# Patient Record
Sex: Male | Born: 1985 | Race: White | Hispanic: No | Marital: Married | Smoking: Never smoker
Health system: Southern US, Community
[De-identification: ages and names within clinical notes are randomized; demographics above are authoritative.]

## PROBLEM LIST (undated history)

## (undated) HISTORY — PX: HERNIA REPAIR: SHX51

---

## 2017-11-14 ENCOUNTER — Emergency Department
Admission: EM | Admit: 2017-11-14 | Discharge: 2017-11-14 | Disposition: A | Discharge status: Home / Self Care | Payer: 59 | Attending: Emergency Medicine | Admitting: Emergency Medicine

## 2017-11-14 ENCOUNTER — Encounter: Payer: Self-pay | Admitting: Emergency Medicine

## 2017-11-14 ENCOUNTER — Emergency Department: Payer: 59

## 2017-11-14 ENCOUNTER — Other Ambulatory Visit: Payer: Self-pay

## 2017-11-14 DIAGNOSIS — Z79899 Other long term (current) drug therapy: Secondary | ICD-10-CM | POA: Diagnosis not present

## 2017-11-14 DIAGNOSIS — R002 Palpitations: Secondary | ICD-10-CM | POA: Insufficient documentation

## 2017-11-14 DIAGNOSIS — R55 Syncope and collapse: Secondary | ICD-10-CM | POA: Diagnosis not present

## 2017-11-14 LAB — FIBRIN DERIVATIVES D-DIMER (ARMC ONLY): Fibrin derivatives D-dimer (ARMC): 256.92 ng/mL (FEU) (ref 0.00–499.00)

## 2017-11-14 LAB — COMPREHENSIVE METABOLIC PANEL
ALK PHOS: 61 U/L (ref 38–126)
ALT: 182 U/L — AB (ref 17–63)
AST: 111 U/L — AB (ref 15–41)
Albumin: 4.7 g/dL (ref 3.5–5.0)
Anion gap: 12 (ref 5–15)
BILIRUBIN TOTAL: 1.4 mg/dL — AB (ref 0.3–1.2)
BUN: 15 mg/dL (ref 6–20)
CALCIUM: 9.3 mg/dL (ref 8.9–10.3)
CO2: 25 mmol/L (ref 22–32)
CREATININE: 0.81 mg/dL (ref 0.61–1.24)
Chloride: 102 mmol/L (ref 101–111)
GFR calc Af Amer: >60 mL/min (ref >60)
GFR calc non Af Amer: >60 mL/min (ref >60)
Glucose, Bld: 102 mg/dL — ABNORMAL HIGH (ref 65–99)
Potassium: 3.6 mmol/L (ref 3.5–5.1)
Sodium: 139 mmol/L (ref 135–145)
TOTAL PROTEIN: 7.8 g/dL (ref 6.5–8.1)

## 2017-11-14 LAB — CBC
HEMATOCRIT: 40.5 % (ref 40.0–52.0)
Hemoglobin: 14.2 g/dL (ref 13.0–18.0)
MCH: 29.4 pg (ref 26.0–34.0)
MCHC: 35.1 g/dL (ref 32.0–36.0)
MCV: 83.8 fL (ref 80.0–100.0)
Platelets: 201 10*3/uL (ref 150–440)
RBC: 4.84 MIL/uL (ref 4.40–5.90)
RDW: 13.3 % (ref 11.5–14.5)
WBC: 9.4 10*3/uL (ref 3.8–10.6)

## 2017-11-14 LAB — TROPONIN I

## 2017-11-14 LAB — APTT: aPTT: 29 seconds (ref 24–36)

## 2017-11-14 LAB — PROTIME-INR
INR: 1
Prothrombin Time: 13.1 seconds (ref 11.4–15.2)

## 2017-11-14 MED ORDER — SODIUM CHLORIDE 0.9 % IV SOLN
1000.0000 mL | Freq: Once | INTRAVENOUS | Status: AC
  Administered 2017-11-14: 1000 mL via INTRAVENOUS

## 2017-11-14 MED ORDER — IOPAMIDOL (ISOVUE-370) INJECTION 76%
75.0000 mL | Freq: Once | INTRAVENOUS | Status: AC | PRN
  Administered 2017-11-14: 75 mL via INTRAVENOUS

## 2017-11-14 NOTE — ED Provider Notes (Signed)
[facility] Emergency Department Provider Note   ____________________________________________    I have reviewed the triage vital signs and the nursing notes.   HISTORY  Chief Complaint Palpitations     HPI Eugene RedheadDavid Hartje Jr. is a 31 y.o. male who presents after palpitations with near syncopal episode.  Patient reports he was walking to a meeting this morning when he started to feel flushed and lightheaded.  He then felt his heart began racing very rapidly.  He sat still for 15 minutes and did feel somewhat better however when he went to walk down the hall started again.  Symptoms started at 8:00 and then again at 830.  He was not comfortable driving to the hospital.  He has never had this before.  He denies chest pain.  He does feel mild shortness of breath when the symptoms were occurring.  No fevers or chills.  Recent travel to [place] however no calf pain or swelling.  No history of PE.   History reviewed. No pertinent past medical history.  There are no active problems to display for this patient.   History reviewed. No pertinent surgical history.  Prior to Admission medications   Medication Sig Start Date End Date Taking? Authorizing Provider  Multiple Vitamin (MULTI-VITAMINS) TABS Take 1 tablet by mouth daily.   Yes [provider]     Allergies Patient has no known allergies.  History reviewed. No pertinent family history.  Social History Social History   Tobacco Use  . Smoking status: Not on file  Substance Use Topics  . Alcohol use: No    Frequency: Never  . Drug use: No    Review of Systems  Constitutional: No fever/chills Eyes: No visual changes.  ENT: No sore throat. Cardiovascular: As above Respiratory: As above Gastrointestinal: No abdominal pain.  No nausea, no vomiting.   Genitourinary: Negative for dysuria. Musculoskeletal: Negative for back pain. Skin: Negative for rash. Neurological: Negative for  headaches    ____________________________________________   PHYSICAL EXAM:  VITAL SIGNS: ED Triage Vitals  Enc Vitals Group     BP 11/14/17 0937 (!) 165/80     Pulse Rate 11/14/17 0937 95     Resp 11/14/17 0937 20     Temp 11/14/17 0937 97.9 F (36.6 C)     Temp Source 11/14/17 0937 Oral     SpO2 11/14/17 0937 99 %     Weight 11/14/17 0938 77.1 kg (170 lb)     Height 11/14/17 0938 1.829 m (6')     Head Circumference --      Peak Flow --      Pain Score 11/14/17 0937 0     Pain Loc --      Pain Edu? --      Excl. in GC? --     Constitutional: Alert and oriented. No acute distress. Pleasant and interactive Eyes: Conjunctivae are normal.   Nose: No congestion/rhinnorhea. Mouth/Throat: Mucous membranes are moist.    Cardiovascular: Normal rate, regular rhythm. Grossly normal heart sounds.  Good peripheral circulation. Respiratory: Normal respiratory effort.  No retractions. Lungs CTAB. Gastrointestinal: Soft and nontender. No distention.  No CVA tenderness. Genitourinary: deferred Musculoskeletal: No lower extremity tenderness nor edema.  Warm and well perfused Neurologic:  Normal speech and language. No gross focal neurologic deficits are appreciated.  Skin:  Skin is warm, dry and intact. No rash noted. Psychiatric: Mood and affect are normal. Speech and behavior are normal.  ____________________________________________   LABS (  all labs ordered are listed, but only abnormal results are displayed)  Labs Reviewed  COMPREHENSIVE METABOLIC PANEL - Abnormal; Notable for the following components:      Result Value   Glucose, Bld 102 (*)    AST 111 (*)    ALT 182 (*)    Total Bilirubin 1.4 (*)    All other components within normal limits  APTT  PROTIME-INR  CBC  TROPONIN I  FIBRIN DERIVATIVES D-DIMER ([facility] ONLY)   ____________________________________________  EKG  ED ECG REPORT I, [name], Nuala Chiles, the attending physician, personally viewed and interpreted this  ECG.  Date: 11/14/2017   Rhythm: normal sinus rhythm QRS Axis: normal Intervals: normal ST/T Wave abnormalities: normal Narrative Interpretation: no evidence of acute ischemia  ____________________________________________  RADIOLOGY  Chest x-ray normal CT angiography normal ____________________________________________   PROCEDURES  Procedure(s) performed: No  Procedures   Critical Care performed: No ____________________________________________   INITIAL IMPRESSION / ASSESSMENT AND PLAN / ED COURSE  Pertinent labs & imaging results that were available during my care of the patient were reviewed by me and considered in my medical decision making (see chart for details).  Patient presents with episode of palpitations, mild shortness of breath.  Now essentially resolved.  Lab work was greatly reassuring, EKG normal.  Despite negative d-dimer the patient did complain of some difficulty taking a deep breath, given this we did order CT angiography after discussion with the patient.  CT angiography was reassuring.  Patient was able to ambulate without symptoms and felt much better.  Discussed with Dr. Kirke CorinArida of cardiology who will follow up with the patient regarding elevated liver enzymes and place long-term monitor to evaluate for arrhythmia    ____________________________________________   FINAL CLINICAL IMPRESSION(S) / ED DIAGNOSES  Final diagnoses:  Heart palpitations        Note:  This document was prepared using Dragon voice recognition software and may include unintentional dictation errors.     [name], Mariadelcarmen Corella, MD 11/14/17 1536

## 2017-11-14 NOTE — ED Notes (Signed)
ED Provider at bedside. 

## 2017-11-14 NOTE — ED Notes (Addendum)
Patient ambulated with a steady gait in the hallway, pulse stayed in the low 90's (92-94), pulse ox in the high 90's to 100. No c/o dyspnea or discomfort was noted. Patient was able to speak in complete sentences without difficulty.

## 2017-11-14 NOTE — ED Triage Notes (Signed)
Pt reports that he was walking to a meeting felt his heart racing, he felt SOB and that he was going to pass out. He got sweaty and tried to drive but was unable a co-worker brought him. States that he is SOB when he talks a little. He did travel [place] and back this weekend.

## 2017-11-21 ENCOUNTER — Other Ambulatory Visit
Admission: RE | Admit: 2017-11-21 | Discharge: 2017-11-21 | Disposition: A | Discharge status: Home / Self Care | Payer: 59 | Source: Ambulatory Visit | Attending: Cardiovascular Disease | Admitting: Cardiovascular Disease

## 2017-11-21 ENCOUNTER — Encounter: Payer: Self-pay | Admitting: Cardiovascular Disease

## 2017-11-21 ENCOUNTER — Ambulatory Visit (INDEPENDENT_AMBULATORY_CARE_PROVIDER_SITE_OTHER): Payer: 59 | Admitting: Cardiovascular Disease

## 2017-11-21 VITALS — BP 112/76 | HR 59 | Ht 72.0 in | Wt 172.2 lb

## 2017-11-21 DIAGNOSIS — Z7689 Persons encountering health services in other specified circumstances: Secondary | ICD-10-CM

## 2017-11-21 DIAGNOSIS — B2790 Infectious mononucleosis, unspecified without complication: Secondary | ICD-10-CM

## 2017-11-21 DIAGNOSIS — R0602 Shortness of breath: Secondary | ICD-10-CM | POA: Diagnosis not present

## 2017-11-21 DIAGNOSIS — R002 Palpitations: Secondary | ICD-10-CM

## 2017-11-21 LAB — HEPATIC FUNCTION PANEL
ALBUMIN: 4.6 g/dL (ref 3.5–5.0)
ALT: 120 U/L — AB (ref 17–63)
AST: 63 U/L — AB (ref 15–41)
Alkaline Phosphatase: 57 U/L (ref 38–126)
BILIRUBIN DIRECT: 0.1 mg/dL (ref 0.1–0.5)
BILIRUBIN TOTAL: 1.1 mg/dL (ref 0.3–1.2)
Indirect Bilirubin: 1 mg/dL — ABNORMAL HIGH (ref 0.3–0.9)
Total Protein: 7.5 g/dL (ref 6.5–8.1)

## 2017-11-21 LAB — MONONUCLEOSIS SCREEN: Mono Screen: NEGATIVE

## 2017-11-21 LAB — TSH: TSH: 2.34 u[IU]/mL (ref 0.350–4.500)

## 2017-11-21 NOTE — Patient Instructions (Addendum)
Medication Instructions:  Your physician recommends that you continue on your current medications as directed. Please refer to the Current Medication list given to you today.   Labwork: TSH, liver profile, mono screen  Testing/Procedures: Your physician has recommended that you wear a holter monitor. Holter monitors are medical devices that record the heart's electrical activity. Doctors most often use these monitors to diagnose arrhythmias. Arrhythmias are problems with the speed or rhythm of the heartbeat. The monitor is a small, portable device. You can wear one while you do your normal daily activities. This is usually used to diagnose what is causing palpitations/syncope (passing out).  Your physician has requested that you have an echocardiogram. Echocardiography is a painless test that uses sound waves to create images of your heart. It provides your doctor with information about the size and shape of your heart and how well your heart's chambers and valves are working. This procedure takes approximately one hour. There are no restrictions for this procedure.    Follow-Up: Your physician recommends that you schedule a follow-up appointment in: 1 month with Dr. Kirke CorinArida.    Any Other Special Instructions Will Be Listed Below (If Applicable). Please consider purchasing an [employer] monitor.      If you need a refill on your cardiac medications before your next appointment, please call your pharmacy.  Echocardiogram An echocardiogram, or echocardiography, uses sound waves (ultrasound) to produce an image of your heart. The echocardiogram is simple, painless, obtained within a short period of time, and offers valuable information to your health care provider. The images from an echocardiogram can provide information such as:  Evidence of coronary artery disease (CAD).  Heart size.  Heart muscle function.  Heart valve function.  Aneurysm detection.  Evidence of a past heart  attack.  Fluid buildup around the heart.  Heart muscle thickening.  Assess heart valve function.  Tell a health care provider about:  Any allergies you have.  All medicines you are taking, including vitamins, herbs, eye drops, creams, and over-the-counter medicines.  Any problems you or family members have had with anesthetic medicines.  Any blood disorders you have.  Any surgeries you have had.  Any medical conditions you have.  Whether you are pregnant or may be pregnant. What happens before the procedure? No special preparation is needed. Eat and drink normally. What happens during the procedure?  In order to produce an image of your heart, gel will be applied to your chest and a wand-like tool (transducer) will be moved over your chest. The gel will help transmit the sound waves from the transducer. The sound waves will harmlessly bounce off your heart to allow the heart images to be captured in real-time motion. These images will then be recorded.  You may need an IV to receive a medicine that improves the quality of the pictures. What happens after the procedure? You may return to your normal schedule including diet, activities, and medicines, unless your health care provider tells you otherwise. This information is not intended to replace advice given to you by your health care provider. Make sure you discuss any questions you have with your health care provider. Document Released: 12/03/2000 Document Revised: 07/24/2016 Document Reviewed: 08/13/2013 Elsevier Interactive Patient Education  2017 Elsevier Inc.  Holter Monitoring A Holter monitor is a small device that is used to detect abnormal heart rhythms. It clips to your clothing and is connected by wires to flat, sticky disks (electrodes) that attach to your chest. It is worn  continuously for 24-48 hours. Follow these instructions at home:  Wear your Holter monitor at all times, even while exercising and sleeping,  for as long as directed by your health care provider.  Make sure that the Holter monitor is safely clipped to your clothing or close to your body as recommended by your health care provider.  Do not get the monitor or wires wet.  Do not put body lotion or moisturizer on your chest.  Keep your skin clean.  Keep a diary of your daily activities, such as walking and doing chores. If you feel that your heartbeat is abnormal or that your heart is fluttering or skipping a beat: ? Record what you are doing when it happens. ? Record what time of day the symptoms occur.  Return your Holter monitor as directed by your health care provider.  Keep all follow-up visits as directed by your health care provider. This is important. Get help right away if:  You feel lightheaded or you faint.  You have trouble breathing.  You feel pain in your chest, upper arm, or jaw.  You feel sick to your stomach and your skin is pale, cool, or damp.  You heartbeat feels unusual or abnormal. This information is not intended to replace advice given to you by your health care provider. Make sure you discuss any questions you have with your health care provider. Document Released: 09/03/2004 Document Revised: 05/13/2016 Document Reviewed: 07/15/2014 Elsevier Interactive Patient Education  [employer]2018 Elsevier Inc.

## 2017-11-21 NOTE — Progress Notes (Signed)
Cardiology Office Note   Date:  11/21/2017   ID:  Eugene Edwards Jr., DOB 02/04/1986, MRN [id number]  PCP:  Patient, No Pcp Per  Cardiologist:   Lorine BearsMuhammad Emireth Cockerham, MD   Chief Complaint  Patient presents with  . other    Heart palpitations, sob, syncopal spells with exertion, confusion. Meds reviewed verbally with pt.      History of Present Illness: Eugene RedheadDavid Drozdowski Jr. is a 31 y.o. male who was [facility] emergency room [facility] for evaluation of palpitations and shortness of breath.  He has no prior cardiac history.  He is not a smoker and has no family history of coronary artery disease.  He reports having an echocardiogram done in [place] earlier this year for atypical chest pain.  The echocardiogram was unremarkable according to him. He does have known history of anxiety and used to be on Paxil in the past but not currently. He had symptoms suggestive of a viral illness before Thanksgiving.  Around that time, he drove to [place] to be with family.  He felt very fatigued.  When he returned he went to work on Monday and had episodes of palpitations with extreme dizziness, shortness of breath and foggy mentation.  He went to the emergency room with the symptoms.  His workup was unremarkable including CTA of the chest.  His labs were unremarkable except for elevated AST and ALT.  He denies excessive alcohol use.  He reports having a total of 6 drinks since October.  Since his visit to the emergency room, he had 2 further episodes of palpitations and dizziness.  He has been extremely anxious since then.  He does not consume excessive amount of caffeine.  He drinks an average of 2 cups coffee daily.  He does complain of increased headache in the back of his head.  He reports increased stress lately.  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    History reviewed. No pertinent past medical history.  Past Surgical History:  Procedure Laterality Date  . HERNIA REPAIR        Current Outpatient Medications  Medication Sig Dispense Refill  . Multiple Vitamin (MULTI-VITAMINS) TABS Take 1 tablet by mouth daily.     No current facility-administered medications for this visit.     Allergies:   Erythromycin    Social History:  The patient  reports that  has never smoked. he has never used smokeless tobacco. He reports that he drinks alcohol. He reports that he does not use drugs.   Family History:  The patient's family history includes Hypertension in his father.    ROS:  Please see the history of present illness.   Otherwise, review of systems are positive for none.   All other systems are reviewed and negative.    PHYSICAL EXAM: VS:  BP 112/76   Pulse (!) 59   Ht 6' (1.829 m)   Wt 172 lb 4 oz (78.1 kg)   BMI 23.36 kg/m  , BMI Body mass index is 23.36 kg/m. GEN: Well nourished, well developed, in no acute distress  HEENT: normal  Neck: no JVD, carotid bruits, or masses Cardiac: RRR; no murmurs, rubs, or gallops,no edema  Respiratory:  clear to auscultation bilaterally, normal work of breathing GI: soft, nontender, nondistended, + BS MS: no deformity or atrophy  Skin: warm and dry, no rash Neuro:  Strength and sensation are intact Psych: euthymic mood, full affect   EKG:  EKG is ordered today.  The ekg ordered today demonstrates sinus bradycardia with no significant ST or T wave changes.   Recent Labs: 11/14/2017: ALT 182; BUN 15; Creatinine, Ser 0.81; Hemoglobin 14.2; Platelets 201; Potassium 3.6; Sodium 139    Lipid Panel No results found for: CHOL, TRIG, HDL, CHOLHDL, VLDL, LDLCALC, LDLDIRECT    Wt Readings from Last 3 Encounters:  11/21/17 172 lb 4 oz (78.1 kg)  11/14/17 170 lb (77.1 kg)      PAD Screen 11/21/2017  Previous PAD dx? No  Previous surgical procedure? No  Pain with walking? No  Feet/toe relief with dangling? No  Painful, non-healing ulcers? No  Extremities discolored? No      ASSESSMENT AND PLAN:  1.   Palpitations with presyncope: The exact etiology is not entirely clear.  His cardiac exam is unremarkable and baseline EKG is normal.  His recent labs were unremarkable except for elevated ALT and AST. I am going to repeat his liver profile and check TSH as well.  He does not consume excessive amount of caffeine.  I requested a 48-hour Holter monitor and suggested that he purchases his own smart phone monitors if his symptoms persist. I do suspect that anxiety is playing a role in his symptoms.  2.  Shortness of breath: I think we have to exclude myocardial involvement with recent viral illness leading to possible myocarditis or pericarditis.  I requested an echocardiogram for evaluation.  I doubt the need for GXT given minimal risk factors for coronary artery disease.  3.  Abnormal liver enzymes: Unclear etiology.  No excessive alcohol intake.  I am going to repeat his labs and check infectious mononucleosis antibodies.    Disposition:   FU with me in 1 month  Signed,  Lorine BearsMuhammad Slayton Lubitz, MD  11/21/2017 2:37 PM    [facility]

## 2017-11-22 ENCOUNTER — Other Ambulatory Visit: Payer: Self-pay

## 2017-11-22 ENCOUNTER — Ambulatory Visit (INDEPENDENT_AMBULATORY_CARE_PROVIDER_SITE_OTHER): Payer: 59

## 2017-11-22 DIAGNOSIS — R0602 Shortness of breath: Secondary | ICD-10-CM

## 2017-11-22 DIAGNOSIS — R002 Palpitations: Secondary | ICD-10-CM

## 2017-11-25 ENCOUNTER — Ambulatory Visit
Admission: RE | Admit: 2017-11-25 | Discharge: 2017-11-25 | Disposition: A | Discharge status: Home / Self Care | Payer: 59 | Source: Ambulatory Visit | Attending: Cardiovascular Disease | Admitting: Cardiovascular Disease

## 2017-11-25 DIAGNOSIS — I491 Atrial premature depolarization: Secondary | ICD-10-CM | POA: Diagnosis not present

## 2017-11-25 DIAGNOSIS — R002 Palpitations: Secondary | ICD-10-CM | POA: Insufficient documentation

## 2017-11-25 DIAGNOSIS — I493 Ventricular premature depolarization: Secondary | ICD-10-CM | POA: Diagnosis not present

## 2017-11-29 ENCOUNTER — Ambulatory Visit: Payer: 59 | Admitting: Cardiovascular Disease

## 2017-12-15 ENCOUNTER — Ambulatory Visit: Payer: 59 | Admitting: Cardiovascular Disease

## 2017-12-21 ENCOUNTER — Encounter: Payer: Self-pay | Admitting: Cardiovascular Disease

## 2017-12-22 ENCOUNTER — Ambulatory Visit: Payer: 59 | Admitting: Nurse Practitioner

## 2019-03-10 IMAGING — CT CT ANGIO CHEST
2 of 7 series · 19 of 46 positions shown · IV contrast (iopamidol)
Comparison: Chest x-ray from same day.

CLINICAL DATA: Acute onset shortness of breath.

EXAM:
CT ANGIOGRAPHY CHEST WITH CONTRAST
TECHNIQUE: Multidetector CT imaging of the chest was performed using the
standard protocol during bolus administration of intravenous
contrast. Multiplanar CT image reconstructions and MIPs were
obtained to evaluate the vascular anatomy.
CONTRAST:  75mL YUXX9L-BWI IOPAMIDOL (YUXX9L-BWI) INJECTION 76%

[Series 5: thins · axial · 0.71mm/px · z∈[-343,-64]mm · 17 of 315 slices shown]
[im 18/315  lung]
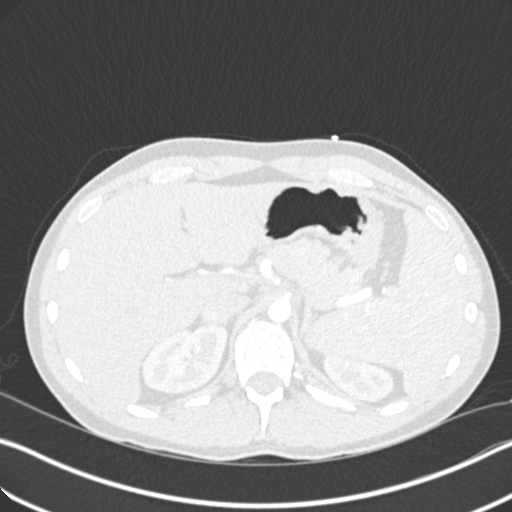
[im 35/315  soft-tissue]
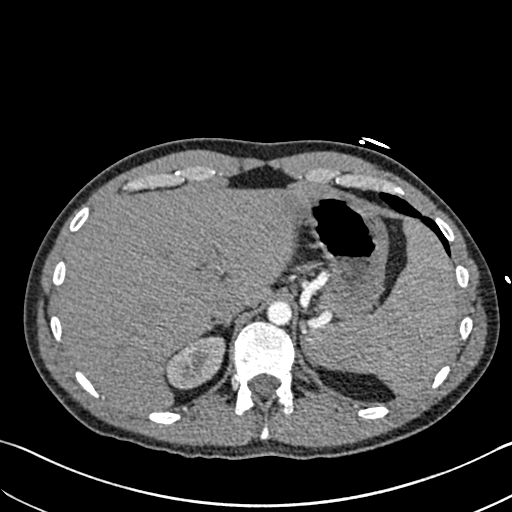
[im 53/315  lung]
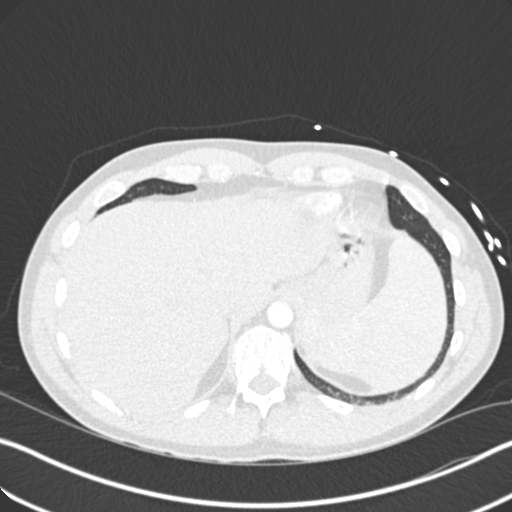
[im 70/315  soft-tissue]
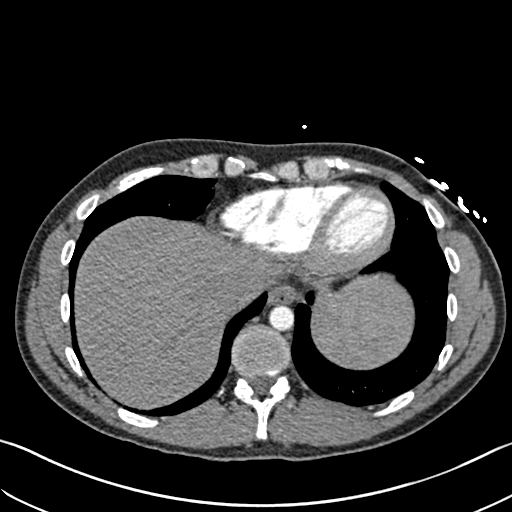
[im 88/315  lung]
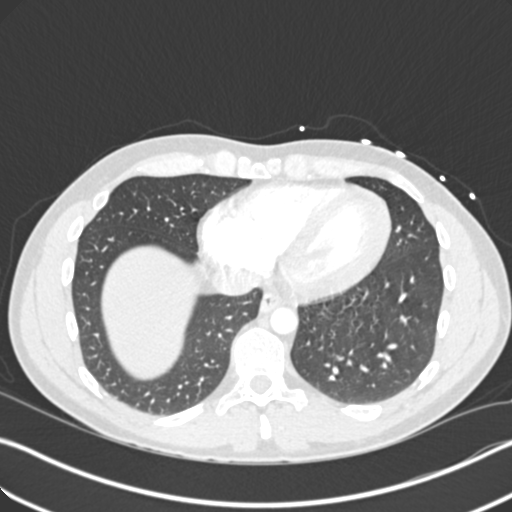
[im 105/315  soft-tissue]
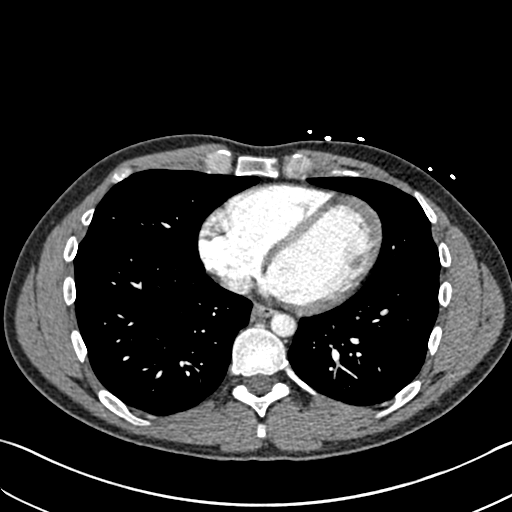
[im 123/315  lung]
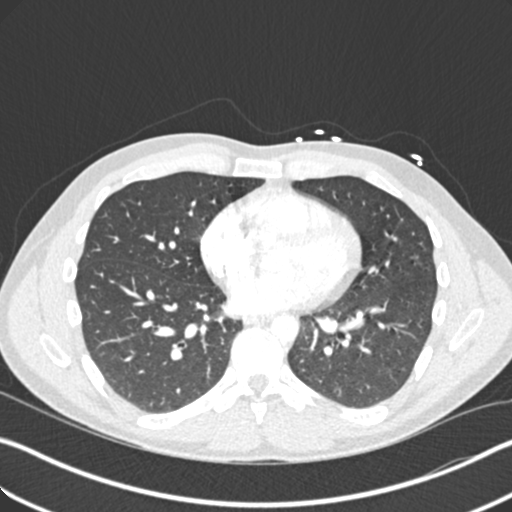
[im 140/315  soft-tissue]
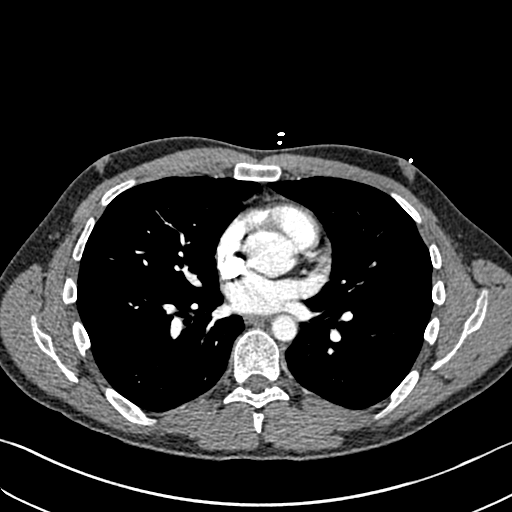
[im 158/315  lung]
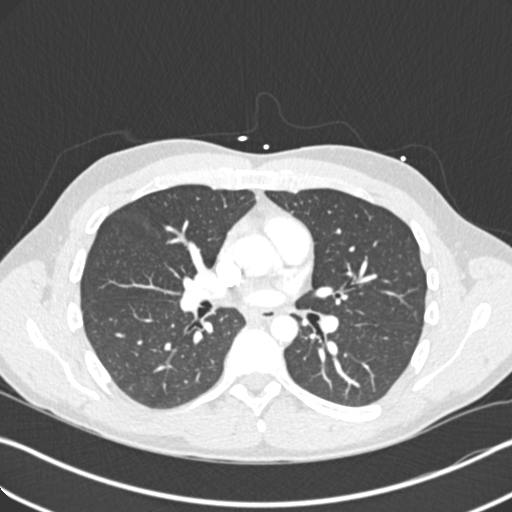
[im 175/315  soft-tissue]
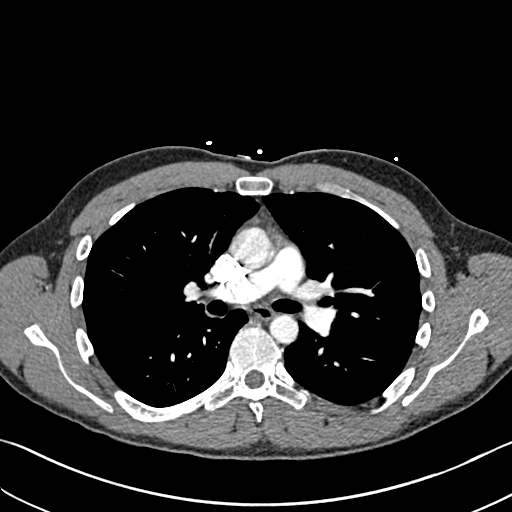
[im 192/315  lung]
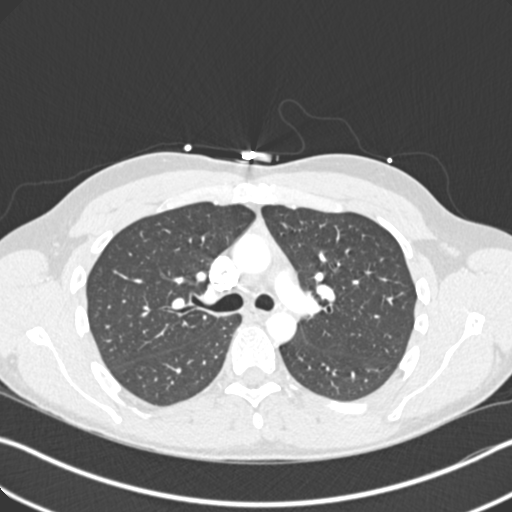
[im 210/315  soft-tissue]
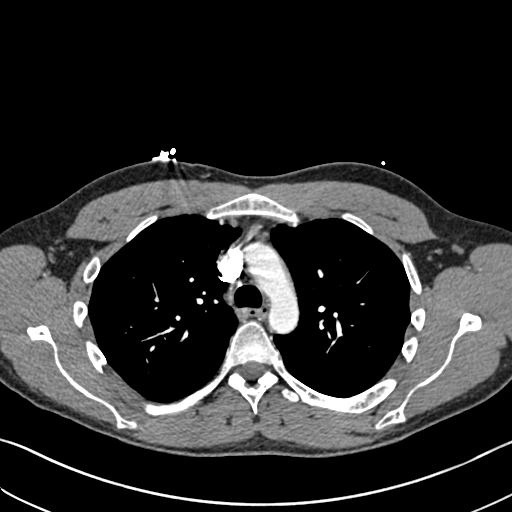
[im 227/315  lung]
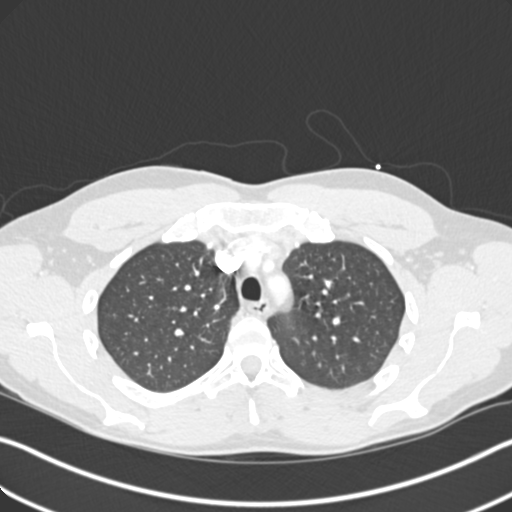
[im 245/315  soft-tissue]
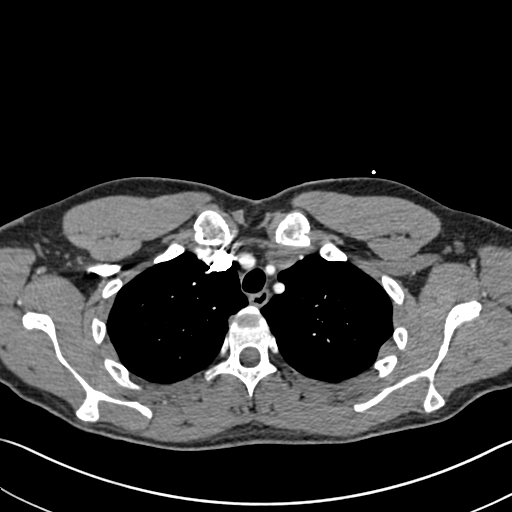
[im 262/315  lung]
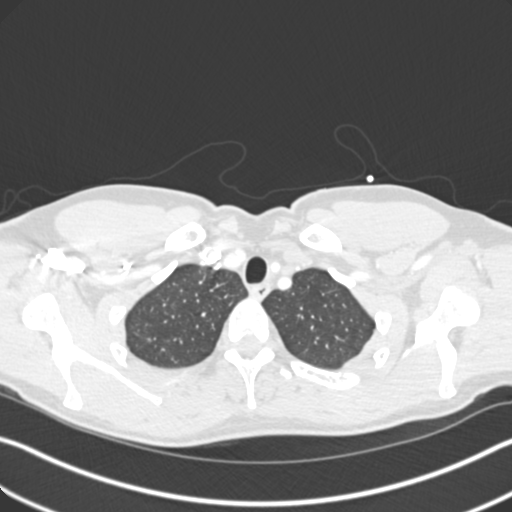
[im 280/315  soft-tissue]
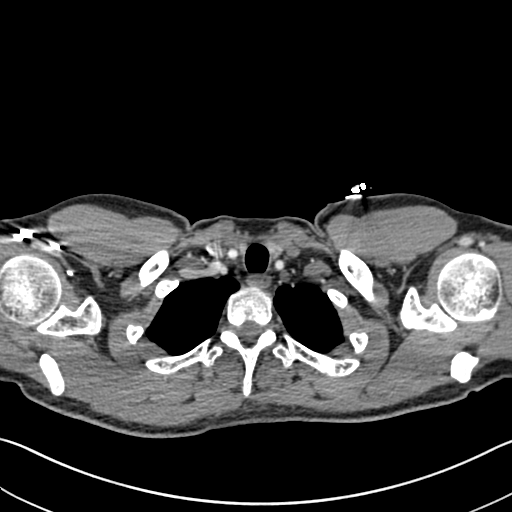
[im 297/315  lung]
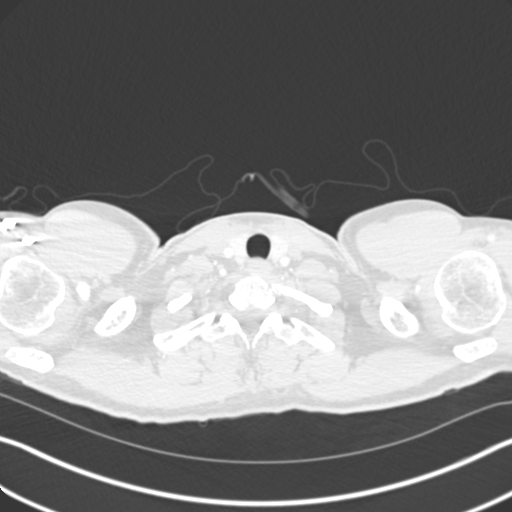

[Series 7: coronal mpr · coronal · 0.62mm/px · 2 of 68 slices shown]
[im 23/68  soft-tissue]
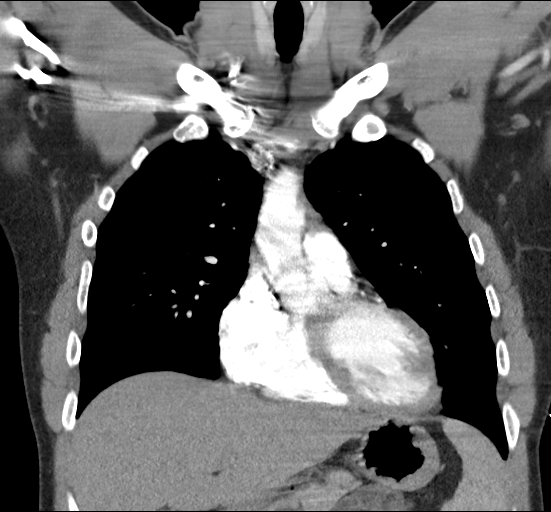
[im 45/68  soft-tissue]
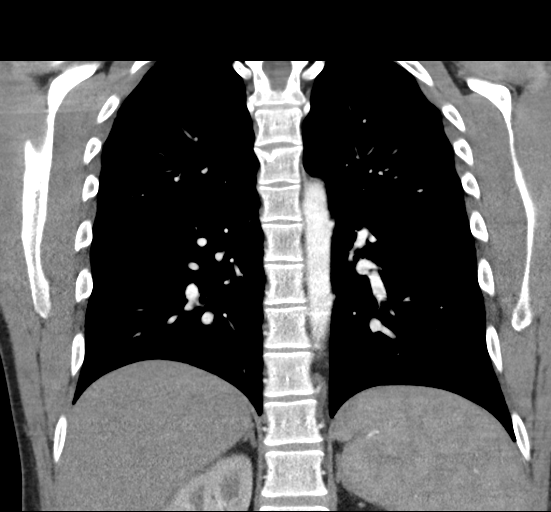

[19 of 46 positions shown; findings below may reference images not displayed]

FINDINGS: Cardiovascular: Satisfactory opacification of the pulmonary arteries
to the segmental level. No evidence of pulmonary embolism. Normal
heart size. No pericardial effusion. Normal caliber thoracic aorta.

Mediastinum/Nodes: No enlarged mediastinal, hilar, or axillary lymph
nodes. Thyroid gland, trachea, and esophagus demonstrate no
significant findings.

Lungs/Pleura: There is a 4 mm nodule in the right lower lobe (series
6, image 68). No focal consolidation, pleural effusion, or
pneumothorax.

Upper Abdomen: No acute abnormality.

Musculoskeletal: Bilateral gynecomastia. No acute or significant
osseous findings.

Review of the MIP images confirms the above findings.
IMPRESSION: 1. No evidence of pulmonary embolism. No acute intrathoracic
process.
2. 4 mm pulmonary nodule in the right lower lobe. No follow-up
needed if patient is low-risk. Non-contrast chest CT can be
considered in 12 months if patient is high-risk. This recommendation
follows the consensus statement: Guidelines for Management of
Incidental Pulmonary Nodules Detected on CT Images: From the
